# Patient Record
Sex: Female | Born: 2000 | Race: Black or African American | Hispanic: No | Marital: Single | Smoking: Never smoker
Health system: Southern US, Community
[De-identification: ages and names within clinical notes are randomized; demographics above are authoritative.]

## PROBLEM LIST (undated history)

## (undated) DIAGNOSIS — T50902D Poisoning by unspecified drugs, medicaments and biological substances, intentional self-harm, subsequent encounter: Secondary | ICD-10-CM

---
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
